# Patient Record
Sex: Female | Born: 1985 | Race: Black or African American | Hispanic: No | Marital: Single | Smoking: Never smoker
Health system: Southern US, Community
[De-identification: ages and names within clinical notes are randomized; demographics above are authoritative.]

## PROBLEM LIST (undated history)

## (undated) HISTORY — PX: LEG SURGERY: SHX1003

---
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
## 2004-12-05 ENCOUNTER — Emergency Department (HOSPITAL_COMMUNITY): Admission: EM | Admit: 2004-12-05 | Discharge: 2004-12-05 | Payer: Self-pay | Admitting: Emergency Medicine

## 2005-04-12 ENCOUNTER — Emergency Department (HOSPITAL_COMMUNITY): Admission: EM | Admit: 2005-04-12 | Discharge: 2005-04-12 | Payer: Self-pay | Admitting: Emergency Medicine

## 2006-05-10 ENCOUNTER — Emergency Department (HOSPITAL_COMMUNITY): Admission: EM | Admit: 2006-05-10 | Discharge: 2006-05-10 | Payer: Self-pay | Admitting: Emergency Medicine

## 2006-09-02 ENCOUNTER — Inpatient Hospital Stay (HOSPITAL_COMMUNITY): Admission: AD | Admit: 2006-09-02 | Discharge: 2006-09-02 | Payer: Self-pay | Admitting: Obstetrics and Gynecology

## 2006-09-10 ENCOUNTER — Inpatient Hospital Stay (HOSPITAL_COMMUNITY): Admission: AD | Admit: 2006-09-10 | Discharge: 2006-09-13 | Payer: Self-pay | Admitting: Obstetrics and Gynecology

## 2007-04-24 ENCOUNTER — Emergency Department (HOSPITAL_COMMUNITY): Admission: EM | Admit: 2007-04-24 | Discharge: 2007-04-24 | Payer: Self-pay | Admitting: Emergency Medicine

## 2010-06-28 NOTE — Discharge Summary (Signed)
Lacey Kelley, Lacey Kelley            ACCOUNT NO.:  [id number]   MEDICAL RECORD NO.:  [id number]          PATIENT TYPE:  INP   LOCATION:  9123                          FACILITY:  [facility]   PHYSICIAN:  Gerrit Friends. Aldona Bar, M.D.   DATE OF BIRTH:  [birth year]   DATE OF ADMISSION:  09/10/2006  DATE OF DISCHARGE:  09/13/2006                               DISCHARGE SUMMARY   DISCHARGE DIAGNOSES:  1. Term pregnancy delivered, 6 pound 11 ounce female infant, Apgars 9      and 9.  2. Blood type B positive.  3. Positive group B strep antenatally.   PROCEDURES:  1. Normal spontaneous delivery.  2. Second-degree tear and repair.  3. Intrapartum antibiotics.   SUMMARY:  This primigravida at 48+ weeks gestation was admitted in labor  after a relatively benign pregnancy.  She requested and received an  epidural, progressed and ultimately underwent a normal spontaneous  delivery with delivery of a 6 pound 11 ounce female infant with Apgars  of 9 and 9 over a second-degree tear which was repaired without  difficulty.  Postpartum course was benign.  Discharge hemoglobin 10.9,  white count 15,000, platelet count 165,000.  On the morning of  09/13/2006 she was ambulating well, tolerating a regular diet well,  having normal bowel and bladder function.  Her vital signs were stable.  She was bottle feeding without difficulty (appropriately instructed) and  desirous of discharge to home.  Accordingly she was given all  appropriate instructions and understood all instructions well.   DISCHARGE MEDICATIONS:  1. Vitamins-one a day until gone.  2. Feosol capsules-one a day.  3. Motrin 600 mg every 6 hours as needed for cramping or pain.  4. Tylox 1-2 every 4-6 hours as needed for more severe pain.   She will return to the office for follow-up in approximately four weeks  time or as needed.   CONDITION ON DISCHARGE:  Improved.      Gerrit Friends. Aldona Bar, M.D.  Electronically Signed     RMW/MEDQ  D:  09/13/2006   T:  09/13/2006  Job:  [id number]

## 2010-11-28 LAB — CBC
HCT: 32.7 — ABNORMAL LOW
HCT: 38.1
Hemoglobin: 10.8 — ABNORMAL LOW
Hemoglobin: 12.8
MCHC: 33.2
MCHC: 33.5
MCV: 86.9
MCV: 87.5
Platelets: 165
Platelets: 206
RBC: 3.74 — ABNORMAL LOW
RBC: 4.39
RDW: 15.2 — ABNORMAL HIGH
RDW: 15.5 — ABNORMAL HIGH
WBC: 15 — ABNORMAL HIGH
WBC: 9.3

## 2010-11-28 LAB — RPR: RPR Ser Ql: NONREACTIVE

## 2010-11-28 LAB — RAPID URINE DRUG SCREEN, HOSP PERFORMED
Amphetamines: NOT DETECTED
Barbiturates: NOT DETECTED
Benzodiazepines: NOT DETECTED
Cocaine: NOT DETECTED
Opiates: NOT DETECTED
Tetrahydrocannabinol: NOT DETECTED

## 2010-11-28 LAB — CCBB MATERNAL DONOR DRAW

## 2011-01-26 ENCOUNTER — Emergency Department (HOSPITAL_COMMUNITY)
Admission: EM | Admit: 2011-01-26 | Discharge: 2011-01-27 | Disposition: A | Discharge status: Home / Self Care | Payer: Self-pay | Attending: Emergency Medicine | Admitting: Emergency Medicine

## 2011-01-26 DIAGNOSIS — S161XXA Strain of muscle, fascia and tendon at neck level, initial encounter: Secondary | ICD-10-CM

## 2011-01-26 DIAGNOSIS — S0083XA Contusion of other part of head, initial encounter: Secondary | ICD-10-CM

## 2011-01-26 DIAGNOSIS — S0990XA Unspecified injury of head, initial encounter: Secondary | ICD-10-CM | POA: Insufficient documentation

## 2011-01-26 DIAGNOSIS — M549 Dorsalgia, unspecified: Secondary | ICD-10-CM | POA: Insufficient documentation

## 2011-01-26 DIAGNOSIS — R404 Transient alteration of awareness: Secondary | ICD-10-CM | POA: Insufficient documentation

## 2011-01-26 DIAGNOSIS — R51 Headache: Secondary | ICD-10-CM | POA: Insufficient documentation

## 2011-01-26 DIAGNOSIS — M542 Cervicalgia: Secondary | ICD-10-CM | POA: Insufficient documentation

## 2011-01-26 DIAGNOSIS — S0003XA Contusion of scalp, initial encounter: Secondary | ICD-10-CM | POA: Insufficient documentation

## 2011-01-26 DIAGNOSIS — S139XXA Sprain of joints and ligaments of unspecified parts of neck, initial encounter: Secondary | ICD-10-CM | POA: Insufficient documentation

## 2011-01-27 ENCOUNTER — Encounter: Payer: Self-pay | Admitting: Emergency Medicine

## 2011-01-27 ENCOUNTER — Emergency Department (HOSPITAL_COMMUNITY): Payer: Self-pay

## 2011-01-27 MED ORDER — HYDROCODONE-ACETAMINOPHEN 5-325 MG PO TABS
1.0000 | ORAL_TABLET | Freq: Once | ORAL | Status: AC
  Administered 2011-01-27: 1 via ORAL
  Filled 2011-01-27: qty 1

## 2011-01-27 MED ORDER — HYDROCODONE-ACETAMINOPHEN 5-325 MG PO TABS: 1.0000 | ORAL_TABLET | Freq: Once | ORAL | Status: DC

## 2011-01-27 MED ORDER — CYCLOBENZAPRINE HCL 10 MG PO TABS: 10.0000 mg | ORAL_TABLET | Freq: Two times a day (BID) | ORAL | Status: AC | PRN

## 2011-01-27 MED ORDER — HYDROCODONE-ACETAMINOPHEN 5-325 MG PO TABS: 2.0000 | ORAL_TABLET | ORAL | Status: AC | PRN
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
## 2011-01-27 NOTE — ED Provider Notes (Signed)
Medical screening examination/treatment/procedure(s) were performed by non-physician practitioner and as supervising physician I was immediately available for consultation/collaboration.  Aylla Huffine K Aarav Burgett-Rasch, MD 01/27/11 0626 
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
## 2014-04-15 ENCOUNTER — Encounter (HOSPITAL_COMMUNITY): Payer: Self-pay | Admitting: *Deleted

## 2014-04-15 ENCOUNTER — Emergency Department (HOSPITAL_COMMUNITY)
Admission: EM | Admit: 2014-04-15 | Discharge: 2014-04-15 | Disposition: A | Discharge status: Home / Self Care | Payer: Medicaid Other | Attending: Emergency Medicine | Admitting: Emergency Medicine

## 2014-04-15 DIAGNOSIS — O9989 Other specified diseases and conditions complicating pregnancy, childbirth and the puerperium: Secondary | ICD-10-CM | POA: Diagnosis not present

## 2014-04-15 DIAGNOSIS — O2311 Infections of bladder in pregnancy, first trimester: Secondary | ICD-10-CM | POA: Diagnosis not present

## 2014-04-15 DIAGNOSIS — Z3A01 Less than 8 weeks gestation of pregnancy: Secondary | ICD-10-CM | POA: Diagnosis not present

## 2014-04-15 DIAGNOSIS — O210 Mild hyperemesis gravidarum: Secondary | ICD-10-CM

## 2014-04-15 DIAGNOSIS — R630 Anorexia: Secondary | ICD-10-CM | POA: Diagnosis not present

## 2014-04-15 DIAGNOSIS — R634 Abnormal weight loss: Secondary | ICD-10-CM | POA: Diagnosis not present

## 2014-04-15 DIAGNOSIS — N3000 Acute cystitis without hematuria: Secondary | ICD-10-CM

## 2014-04-15 LAB — CBC WITH DIFFERENTIAL/PLATELET
BASOS ABS: 0 10*3/uL (ref 0.0–0.1)
BASOS PCT: 0 % (ref 0–1)
Eosinophils Absolute: 0 10*3/uL (ref 0.0–0.7)
Eosinophils Relative: 0 % (ref 0–5)
HCT: 43.4 % (ref 36.0–46.0)
HEMOGLOBIN: 15.4 g/dL — AB (ref 12.0–15.0)
LYMPHS ABS: 1.3 10*3/uL (ref 0.7–4.0)
LYMPHS PCT: 17 % (ref 12–46)
MCH: 30.7 pg (ref 26.0–34.0)
MCHC: 35.5 g/dL (ref 30.0–36.0)
MCV: 86.5 fL (ref 78.0–100.0)
MONO ABS: 0.5 10*3/uL (ref 0.1–1.0)
Monocytes Relative: 6 % (ref 3–12)
NEUTROS ABS: 6.1 10*3/uL (ref 1.7–7.7)
NEUTROS PCT: 77 % (ref 43–77)
PLATELETS: 274 10*3/uL (ref 150–400)
RBC: 5.02 MIL/uL (ref 3.87–5.11)
RDW: 12.4 % (ref 11.5–15.5)
WBC: 8 10*3/uL (ref 4.0–10.5)

## 2014-04-15 LAB — URINE MICROSCOPIC-ADD ON

## 2014-04-15 LAB — COMPREHENSIVE METABOLIC PANEL
ALBUMIN: 4.2 g/dL (ref 3.5–5.2)
ALT: 13 U/L (ref 0–35)
ANION GAP: 7 (ref 5–15)
AST: 22 U/L (ref 0–37)
Alkaline Phosphatase: 44 U/L (ref 39–117)
BILIRUBIN TOTAL: 1.4 mg/dL — AB (ref 0.3–1.2)
BUN: 10 mg/dL (ref 6–23)
CHLORIDE: 104 mmol/L (ref 96–112)
CO2: 24 mmol/L (ref 19–32)
CREATININE: 0.73 mg/dL (ref 0.50–1.10)
Calcium: 9.3 mg/dL (ref 8.4–10.5)
Glucose, Bld: 71 mg/dL (ref 70–99)
Potassium: 3.4 mmol/L — ABNORMAL LOW (ref 3.5–5.1)
Sodium: 135 mmol/L (ref 135–145)
TOTAL PROTEIN: 7.5 g/dL (ref 6.0–8.3)

## 2014-04-15 LAB — URINALYSIS, ROUTINE W REFLEX MICROSCOPIC
BILIRUBIN URINE: NEGATIVE
Glucose, UA: NEGATIVE mg/dL
NITRITE: NEGATIVE
Protein, ur: 100 mg/dL — AB
SPECIFIC GRAVITY, URINE: 1.037 — AB (ref 1.005–1.030)
UROBILINOGEN UA: 1 mg/dL (ref 0.0–1.0)
pH: 6 (ref 5.0–8.0)

## 2014-04-15 LAB — I-STAT BETA HCG BLOOD, ED (MC, WL, AP ONLY): I-stat hCG, quantitative: >2000 m[IU]/mL — ABNORMAL HIGH (ref <5)

## 2014-04-15 LAB — LIPASE, BLOOD: LIPASE: 25 U/L (ref 11–59)

## 2014-04-15 MED ORDER — SODIUM CHLORIDE 0.9 % IV BOLUS (SEPSIS)
1000.0000 mL | Freq: Once | INTRAVENOUS | Status: AC
  Administered 2014-04-15: 1000 mL via INTRAVENOUS

## 2014-04-15 MED ORDER — PROMETHAZINE HCL 25 MG RE SUPP: 25.0000 mg | Freq: Four times a day (QID) | RECTAL | Status: AC | PRN

## 2014-04-15 MED ORDER — METOCLOPRAMIDE HCL 10 MG PO TABS: 10.0000 mg | ORAL_TABLET | Freq: Four times a day (QID) | ORAL | Status: AC

## 2014-04-15 MED ORDER — PROMETHAZINE HCL 25 MG PO TABS: 25.0000 mg | ORAL_TABLET | Freq: Four times a day (QID) | ORAL | Status: AC | PRN

## 2014-04-15 MED ORDER — METOCLOPRAMIDE HCL 5 MG/ML IJ SOLN
10.0000 mg | Freq: Once | INTRAMUSCULAR | Status: AC
  Administered 2014-04-15: 10 mg via INTRAVENOUS
  Filled 2014-04-15: qty 2

## 2014-04-15 MED ORDER — CEPHALEXIN 500 MG PO CAPS: 500.0000 mg | ORAL_CAPSULE | Freq: Two times a day (BID) | ORAL | Status: DC

## 2014-04-15 MED ORDER — PROMETHAZINE HCL 25 MG/ML IJ SOLN
12.5000 mg | Freq: Once | INTRAMUSCULAR | Status: AC
  Administered 2014-04-15: 12.5 mg via INTRAVENOUS
  Filled 2014-04-15 (×2): qty 1

## 2014-04-15 NOTE — ED Notes (Signed)
Pt made aware to return if symptoms worsen or if any life threatening symptoms occur.   

## 2014-04-15 NOTE — ED Notes (Signed)
Pt reports emesis for the past 2 weeks and found out today that she is pregnant. Pt has not had a bowel movement for 1 week.  Pt alert and oriented with constant nausea.

## 2014-04-15 NOTE — ED Provider Notes (Signed)
CSN: [id number]     Arrival date & time 04/15/14  1222 History   First MD Initiated Contact with Patient 04/15/14 1457     Chief Complaint  Patient presents with  . Emesis     (Consider location/radiation/quality/duration/timing/severity/associated sxs/prior Treatment) Patient is a 29 y.o. female presenting with vomiting. The history is provided by the patient.  Emesis Severity:  Moderate Associated symptoms: no abdominal pain, no diarrhea and no headaches    patient's had nausea and vomiting for the last 2 weeks. States she has lost 15 pounds. She has she has a decreased appetite. She has had the feeling she needs had diarrhea but is not. She states she's had some nasal congestion also. no fevers. No cough. She states her daughter had similar symptoms. Her last menses was the middle of January. No dysuria. No chest pain. No trouble breathing.  History reviewed. No pertinent past medical history. Past Surgical History  Procedure Laterality Date  . Leg surgery     History reviewed. No pertinent family history. History  Substance Use Topics  . Smoking status: Never Smoker   . Smokeless tobacco: Not on file  . Alcohol Use: No   OB History    No data available     Review of Systems  Constitutional: Positive for appetite change and unexpected weight change. Negative for fever and activity change.  Eyes: Negative for pain.  Respiratory: Negative for chest tightness and shortness of breath.   Cardiovascular: Negative for chest pain and leg swelling.  Gastrointestinal: Positive for nausea and vomiting. Negative for abdominal pain and diarrhea.  Genitourinary: Negative for hematuria and flank pain.  Musculoskeletal: Negative for back pain and neck stiffness.  Skin: Negative for rash.  Neurological: Negative for weakness, numbness and headaches.  Psychiatric/Behavioral: Negative for behavioral problems.      Allergies  Review of patient's allergies indicates no known  allergies.  Home Medications   Prior to Admission medications   Medication Sig Start Date End Date Taking? Authorizing Provider  cephALEXin (KEFLEX) 500 MG capsule Take 1 capsule (500 mg total) by mouth 2 (two) times daily. 04/15/14   Juliet Rude. Iva Posten, MD  metoCLOPramide (REGLAN) 10 MG tablet Take 1 tablet (10 mg total) by mouth every 6 (six) hours. 04/15/14   Juliet Rude. Rubin Payor, MD  promethazine (PHENERGAN) 25 MG suppository Place 1 suppository (25 mg total) rectally every 6 (six) hours as needed for nausea or vomiting. 04/15/14   Juliet Rude. Rubin Payor, MD  promethazine (PHENERGAN) 25 MG tablet Take 1 tablet (25 mg total) by mouth every 6 (six) hours as needed for nausea. 04/15/14   Juliet Rude. Deborra Phegley, MD   BP 101/54 mmHg  Pulse 60  Temp(Src) 98 F (36.7 C)  Resp 20  SpO2 99%  LMP 02/24/2014 Physical Exam  Constitutional: She is oriented to person, place, and time. She appears well-developed and well-nourished.  HENT:  Head: Normocephalic and atraumatic.  Cardiovascular: Normal rate, regular rhythm and normal heart sounds.   No murmur heard. Pulmonary/Chest: Effort normal and breath sounds normal. No respiratory distress. She has no wheezes. She has no rales.  Abdominal: Soft. Bowel sounds are normal. She exhibits no distension and no mass. There is no tenderness. There is no rebound and no guarding.  Musculoskeletal: Normal range of motion.  Neurological: She is alert and oriented to person, place, and time. No cranial nerve deficit.  Skin: Skin is warm.  Psychiatric: She has a normal mood and affect. Her speech is normal.  Nursing note and vitals reviewed.   ED Course  Procedures (including critical care time) Labs Review Labs Reviewed  CBC WITH DIFFERENTIAL/PLATELET - Abnormal; Notable for the following:    Hemoglobin 15.4 (*)    All other components within normal limits  COMPREHENSIVE METABOLIC PANEL - Abnormal; Notable for the following:    Potassium 3.4 (*)    Total  Bilirubin 1.4 (*)    All other components within normal limits  URINALYSIS, ROUTINE W REFLEX MICROSCOPIC - Abnormal; Notable for the following:    Color, Urine AMBER (*)    APPearance CLOUDY (*)    Specific Gravity, Urine 1.037 (*)    Hgb urine dipstick TRACE (*)    Ketones, ur >80 (*)    Protein, ur 100 (*)    Leukocytes, UA LARGE (*)    All other components within normal limits  URINE MICROSCOPIC-ADD ON - Abnormal; Notable for the following:    Squamous Epithelial / LPF MANY (*)    Bacteria, UA FEW (*)    All other components within normal limits  I-STAT BETA HCG BLOOD, ED ([facility], [facility], [facility] ONLY) - Abnormal; Notable for the following:    I-stat hCG, quantitative >2000.0 (*)    All other components within normal limits  URINE CULTURE  LIPASE, BLOOD    Imaging Review No results found.   EKG Interpretation None      MDM   Final diagnoses:  Hyperemesis gravidarum  Acute cystitis without hematuria    Patient with nausea and vomiting. Maybe hyperemesis and she was found to be pregnant. Does have bacterial urine treated. Patient feels somewhat better after treatment. Has had 4 L of fluid. I discussed with OB who recommended adding Reglan. Will follow[facility]up with [facility].    Juliet RudeNathan R. Rubin PayorPickering, MD 04/17/14 [id number]

## 2014-04-15 NOTE — Discharge Instructions (Signed)
Try to take fluids. Return for fevers. Follow up with [facility] clinic.   Hyperemesis Gravidarum Hyperemesis gravidarum is a severe form of nausea and vomiting that happens during pregnancy. Hyperemesis is worse than morning sickness. It may cause you to have nausea or vomiting all day for many days. It may keep you from eating and drinking enough food and liquids. Hyperemesis usually occurs during the first half (the first 20 weeks) of pregnancy. It often goes away once a woman is in her second half of pregnancy. However, sometimes hyperemesis continues through an entire pregnancy.  CAUSES  The cause of this condition is not completely known but is thought to be related to changes in the body's hormones when pregnant. It could be from the high level of the pregnancy hormone or an increase in estrogen in the body.  SIGNS AND SYMPTOMS   Severe nausea and vomiting.  Nausea that does not go away.  Vomiting that does not allow you to keep any food down.  Weight loss and body fluid loss (dehydration).  Having no desire to eat or not liking food you have previously enjoyed. DIAGNOSIS  Your health care provider will do a physical exam and ask you about your symptoms. He or she may also order blood tests and urine tests to make sure something else is not causing the problem.  TREATMENT  You may only need medicine to control the problem. If medicines do not control the nausea and vomiting, you will be treated in the hospital to prevent dehydration, increased acid in the blood (acidosis), weight loss, and changes in the electrolytes in your body that may harm the unborn baby (fetus). You may need IV fluids.  HOME CARE INSTRUCTIONS   Only take over-the-counter or prescription medicines as directed by your health care provider.  Try eating a couple of dry crackers or toast in the morning before getting out of bed.  Avoid foods and smells that upset your stomach.  Avoid fatty and spicy  foods.  Eat 5-6 small meals a day.  Do not drink when eating meals. Drink between meals.  For snacks, eat high-protein foods, such as cheese.  Eat or suck on things that have ginger in them. Ginger helps nausea.  Avoid food preparation. The smell of food can spoil your appetite.  Avoid iron pills and iron in your multivitamins until after 3-4 months of being pregnant. However, consult with your health care provider before stopping any prescribed iron pills. SEEK MEDICAL CARE IF:   Your abdominal pain increases.  You have a severe headache.  You have vision problems.  You are losing weight. SEEK IMMEDIATE MEDICAL CARE IF:   You are unable to keep fluids down.  You vomit blood.  You have constant nausea and vomiting.  You have excessive weakness.  You have extreme thirst.  You have dizziness or fainting.  You have a fever or persistent symptoms for more than 2-3 days.  You have a fever and your symptoms suddenly get worse. MAKE SURE YOU:   Understand these instructions.  Will watch your condition.  Will get help right away if you are not doing well or get worse. Document Released: 01/30/2005 Document Revised: 11/20/2012 Document Reviewed: 09/11/2012 [facility] Patient Information 2015 [place], [place]. This information is not intended to replace advice given to you by your health care provider. Make sure you discuss any questions you have with your health care provider. Pregnancy and Urinary Tract Infection A urinary tract infection (UTI) is a  bacterial infection of the urinary tract. Infection of the urinary tract can include the ureters, kidneys (pyelonephritis), bladder (cystitis), and urethra (urethritis). All pregnant women should be screened for bacteria in the urinary tract. Identifying and treating a UTI will decrease the risk of preterm labor and developing more serious infections in both the mother and baby. CAUSES Bacteria germs cause almost all UTIs.  RISK  FACTORS Many factors can increase your chances of getting a UTI during pregnancy. These include:  Having a short urethra.  Poor toilet and hygiene habits.  Sexual intercourse.  Blockage of urine along the urinary tract.  Problems with the pelvic muscles or nerves.  Diabetes.  Obesity.  Bladder problems after having several children.  Previous history of UTI. SIGNS AND SYMPTOMS   Pain, burning, or a stinging feeling when urinating.  Suddenly feeling the need to urinate right away (urgency).  Loss of bladder control (urinary incontinence).  Frequent urination, more than is common with pregnancy.  Lower abdominal or back discomfort.  Cloudy urine.  Blood in the urine (hematuria).  Fever. When the kidneys are infected, the symptoms may be:  Back pain.  Flank pain on the right side more so than the left.  Fever.  Chills.  Nausea.  Vomiting. DIAGNOSIS  A urinary tract infection is usually diagnosed through urine tests. Additional tests and procedures are sometimes done. These may include:  Ultrasound exam of the kidneys, ureters, bladder, and urethra.  Looking in the bladder with a lighted tube (cystoscopy). TREATMENT Typically, UTIs can be treated with antibiotic medicines.  HOME CARE INSTRUCTIONS   Only take over-the-counter or prescription medicines as directed by your health care provider. If you were prescribed antibiotics, take them as directed. Finish them even if you start to feel better.  Drink enough fluids to keep your urine clear or pale yellow.  Do not have sexual intercourse until the infection is gone and your health care provider says it is okay.  Make sure you are tested for UTIs throughout your pregnancy. These infections often come back. Preventing a UTI in the Future  Practice good toilet habits. Always wipe from front to back. Use the tissue only once.  Do not hold your urine. Empty your bladder as soon as possible when the urge  comes.  Do not douche or use deodorant sprays.  Wash with soap and warm water around the genital area and the anus.  Empty your bladder before and after sexual intercourse.  Wear underwear with a cotton crotch.  Avoid caffeine and carbonated drinks. They can irritate the bladder.  Drink cranberry juice or take cranberry pills. This may decrease the risk of getting a UTI.  Do not drink alcohol.  Keep all your appointments and tests as scheduled. SEEK MEDICAL CARE IF:   Your symptoms get worse.  You are still having fevers 2 or more days after treatment begins.  You have a rash.  You feel that you are having problems with medicines prescribed.  You have abnormal vaginal discharge. SEEK IMMEDIATE MEDICAL CARE IF:   You have back or flank pain.  You have chills.  You have blood in your urine.  You have nausea and vomiting.  You have contractions of your uterus.  You have a gush of fluid from the vagina. MAKE SURE YOU:  Understand these instructions.   Will watch your condition.   Will get help right away if you are not doing well or get worse.  Document Released: 05/27/2010 Document Revised: 11/20/2012  Document Reviewed: 08/29/2012 [facility] Patient Information 2015 [place]. This information is not intended to replace advice given to you by your health care provider. Make sure you discuss any questions you have with your health care provider.

## 2014-04-15 NOTE — ED Notes (Signed)
Pt in c/o n/v for the last two weeks, states the nausea is constant, LMP was middle of January, states her daughter had similar symptoms two weeks ago but her illness resolved after 1-2 days, pt symptoms have not improved, no distress noted

## 2014-04-17 LAB — URINE CULTURE: Colony Count: >=100000

## 2014-04-18 ENCOUNTER — Emergency Department (HOSPITAL_COMMUNITY)
Admission: EM | Admit: 2014-04-18 | Discharge: 2014-04-18 | Discharge status: Against Medical Advice | Payer: Medicaid Other | Attending: Emergency Medicine | Admitting: Emergency Medicine

## 2014-04-18 ENCOUNTER — Encounter (HOSPITAL_COMMUNITY): Payer: Self-pay | Admitting: Emergency Medicine

## 2014-04-18 DIAGNOSIS — O209 Hemorrhage in early pregnancy, unspecified: Secondary | ICD-10-CM | POA: Insufficient documentation

## 2014-04-18 DIAGNOSIS — O9989 Other specified diseases and conditions complicating pregnancy, childbirth and the puerperium: Secondary | ICD-10-CM | POA: Insufficient documentation

## 2014-04-18 DIAGNOSIS — R110 Nausea: Secondary | ICD-10-CM | POA: Insufficient documentation

## 2014-04-18 DIAGNOSIS — Z3A01 Less than 8 weeks gestation of pregnancy: Secondary | ICD-10-CM | POA: Diagnosis not present

## 2014-04-18 NOTE — ED Notes (Signed)
The patient was called several times and she did not answer.  

## 2014-04-18 NOTE — ED Notes (Signed)
The patient was called several times and she did not answer.

## 2014-04-18 NOTE — ED Notes (Signed)
The patient says she was here two weeks ago for nausea and was told she was [redacted]weeks pregnant.  Today she presents with vaginal bleeding soaking through one pad an hour.  The patient denies pain.

## 2015-12-08 ENCOUNTER — Encounter (HOSPITAL_COMMUNITY): Payer: Self-pay

## 2015-12-08 ENCOUNTER — Emergency Department (HOSPITAL_COMMUNITY)
Admission: EM | Admit: 2015-12-08 | Discharge: 2015-12-08 | Disposition: A | Discharge status: Home / Self Care | Payer: No Typology Code available for payment source | Attending: Emergency Medicine | Admitting: Emergency Medicine

## 2015-12-08 ENCOUNTER — Emergency Department (HOSPITAL_COMMUNITY): Payer: No Typology Code available for payment source

## 2015-12-08 DIAGNOSIS — M545 Low back pain: Secondary | ICD-10-CM | POA: Diagnosis not present

## 2015-12-08 DIAGNOSIS — Y999 Unspecified external cause status: Secondary | ICD-10-CM | POA: Diagnosis not present

## 2015-12-08 DIAGNOSIS — S199XXA Unspecified injury of neck, initial encounter: Secondary | ICD-10-CM | POA: Insufficient documentation

## 2015-12-08 DIAGNOSIS — R0781 Pleurodynia: Secondary | ICD-10-CM | POA: Diagnosis not present

## 2015-12-08 DIAGNOSIS — Y92410 Unspecified street and highway as the place of occurrence of the external cause: Secondary | ICD-10-CM | POA: Diagnosis not present

## 2015-12-08 DIAGNOSIS — Y939 Activity, unspecified: Secondary | ICD-10-CM | POA: Diagnosis not present

## 2015-12-08 LAB — POC URINE PREG, ED: PREG TEST UR: NEGATIVE

## 2015-12-08 MED ORDER — NAPROXEN SODIUM 220 MG PO TABS: 440.0000 mg | ORAL_TABLET | Freq: Two times a day (BID) | ORAL | 0 refills | Status: AC

## 2015-12-08 MED ORDER — CYCLOBENZAPRINE HCL 10 MG PO TABS: 10.0000 mg | ORAL_TABLET | Freq: Two times a day (BID) | ORAL | 0 refills | Status: AC | PRN

## 2015-12-08 MED ORDER — HYDROCODONE-ACETAMINOPHEN 5-325 MG PO TABS
1.0000 | ORAL_TABLET | Freq: Once | ORAL | Status: AC
  Administered 2015-12-08: 1 via ORAL
  Filled 2015-12-08: qty 1
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
